# Patient Record
Sex: Female | Born: 1965 | Race: White | Hispanic: No | Marital: Married | Smoking: Never smoker
Health system: Southern US, Community
[De-identification: ages and names within clinical notes are randomized; demographics above are authoritative.]

## PROBLEM LIST (undated history)

## (undated) DIAGNOSIS — N6012 Diffuse cystic mastopathy of left breast: Secondary | ICD-10-CM

## (undated) DIAGNOSIS — E039 Hypothyroidism, unspecified: Secondary | ICD-10-CM

## (undated) HISTORY — PX: BREAST EXCISIONAL BIOPSY: SUR124

---
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
## 1999-06-23 ENCOUNTER — Other Ambulatory Visit: Admission: RE | Admit: 1999-06-23 | Discharge: 1999-06-23 | Payer: Self-pay | Admitting: Obstetrics & Gynecology

## 2000-07-27 ENCOUNTER — Other Ambulatory Visit: Admission: RE | Admit: 2000-07-27 | Discharge: 2000-07-27 | Payer: Self-pay | Admitting: Obstetrics & Gynecology

## 2001-08-21 ENCOUNTER — Other Ambulatory Visit: Admission: RE | Admit: 2001-08-21 | Discharge: 2001-08-21 | Payer: Self-pay | Admitting: Obstetrics & Gynecology

## 2002-10-02 ENCOUNTER — Other Ambulatory Visit: Admission: RE | Admit: 2002-10-02 | Discharge: 2002-10-02 | Payer: Self-pay | Admitting: Obstetrics and Gynecology

## 2003-10-22 ENCOUNTER — Other Ambulatory Visit: Admission: RE | Admit: 2003-10-22 | Discharge: 2003-10-22 | Payer: Self-pay | Admitting: Obstetrics & Gynecology

## 2004-12-02 ENCOUNTER — Other Ambulatory Visit: Admission: RE | Admit: 2004-12-02 | Discharge: 2004-12-02 | Payer: Self-pay | Admitting: Obstetrics & Gynecology

## 2005-12-20 ENCOUNTER — Other Ambulatory Visit: Admission: RE | Admit: 2005-12-20 | Discharge: 2005-12-20 | Payer: Self-pay | Admitting: Obstetrics & Gynecology

## 2017-10-08 DIAGNOSIS — Z681 Body mass index (BMI) 19 or less, adult: Secondary | ICD-10-CM | POA: Diagnosis not present

## 2017-10-08 DIAGNOSIS — Z01419 Encounter for gynecological examination (general) (routine) without abnormal findings: Secondary | ICD-10-CM | POA: Diagnosis not present

## 2017-10-25 DIAGNOSIS — H6120 Impacted cerumen, unspecified ear: Secondary | ICD-10-CM | POA: Diagnosis not present

## 2017-10-30 DIAGNOSIS — H6123 Impacted cerumen, bilateral: Secondary | ICD-10-CM | POA: Diagnosis not present

## 2017-10-30 DIAGNOSIS — H900 Conductive hearing loss, bilateral: Secondary | ICD-10-CM | POA: Diagnosis not present

## 2017-11-07 DIAGNOSIS — H10029 Other mucopurulent conjunctivitis, unspecified eye: Secondary | ICD-10-CM | POA: Diagnosis not present

## 2017-11-26 DIAGNOSIS — N390 Urinary tract infection, site not specified: Secondary | ICD-10-CM | POA: Diagnosis not present

## 2017-11-26 DIAGNOSIS — R300 Dysuria: Secondary | ICD-10-CM | POA: Diagnosis not present

## 2018-02-18 DIAGNOSIS — E559 Vitamin D deficiency, unspecified: Secondary | ICD-10-CM | POA: Diagnosis not present

## 2018-02-18 DIAGNOSIS — Z0000 Encounter for general adult medical examination without abnormal findings: Secondary | ICD-10-CM | POA: Diagnosis not present

## 2018-02-18 DIAGNOSIS — E039 Hypothyroidism, unspecified: Secondary | ICD-10-CM | POA: Diagnosis not present

## 2018-02-25 DIAGNOSIS — Z0000 Encounter for general adult medical examination without abnormal findings: Secondary | ICD-10-CM | POA: Diagnosis not present

## 2018-02-25 DIAGNOSIS — R748 Abnormal levels of other serum enzymes: Secondary | ICD-10-CM | POA: Diagnosis not present

## 2018-03-19 ENCOUNTER — Other Ambulatory Visit: Payer: Self-pay | Admitting: Radiology

## 2018-03-19 DIAGNOSIS — N643 Galactorrhea not associated with childbirth: Secondary | ICD-10-CM | POA: Diagnosis not present

## 2018-03-19 DIAGNOSIS — O926 Galactorrhea: Secondary | ICD-10-CM | POA: Diagnosis not present

## 2018-03-19 DIAGNOSIS — N6452 Nipple discharge: Secondary | ICD-10-CM

## 2018-03-20 ENCOUNTER — Ambulatory Visit
Admission: RE | Admit: 2018-03-20 | Discharge: 2018-03-20 | Disposition: A | Discharge status: Home / Self Care | Payer: 59 | Source: Ambulatory Visit | Attending: Radiology | Admitting: Radiology

## 2018-03-20 ENCOUNTER — Other Ambulatory Visit: Payer: Self-pay | Admitting: Radiology

## 2018-03-20 DIAGNOSIS — N6320 Unspecified lump in the left breast, unspecified quadrant: Secondary | ICD-10-CM | POA: Diagnosis not present

## 2018-03-20 DIAGNOSIS — N6452 Nipple discharge: Secondary | ICD-10-CM

## 2018-03-20 DIAGNOSIS — R922 Inconclusive mammogram: Secondary | ICD-10-CM | POA: Diagnosis not present

## 2018-03-26 ENCOUNTER — Other Ambulatory Visit: Payer: Self-pay | Admitting: Radiology

## 2018-03-26 ENCOUNTER — Ambulatory Visit
Admission: RE | Admit: 2018-03-26 | Discharge: 2018-03-26 | Disposition: A | Discharge status: Home / Self Care | Payer: 59 | Source: Ambulatory Visit | Attending: Radiology | Admitting: Radiology

## 2018-03-26 DIAGNOSIS — N6320 Unspecified lump in the left breast, unspecified quadrant: Secondary | ICD-10-CM

## 2018-03-26 DIAGNOSIS — D242 Benign neoplasm of left breast: Secondary | ICD-10-CM | POA: Diagnosis not present

## 2018-03-26 DIAGNOSIS — N6342 Unspecified lump in left breast, subareolar: Secondary | ICD-10-CM | POA: Diagnosis not present

## 2018-04-10 ENCOUNTER — Other Ambulatory Visit: Payer: Self-pay | Admitting: General Surgery

## 2018-04-10 DIAGNOSIS — E039 Hypothyroidism, unspecified: Secondary | ICD-10-CM | POA: Diagnosis not present

## 2018-04-10 DIAGNOSIS — N6012 Diffuse cystic mastopathy of left breast: Secondary | ICD-10-CM

## 2018-04-10 DIAGNOSIS — Z803 Family history of malignant neoplasm of breast: Secondary | ICD-10-CM | POA: Diagnosis not present

## 2018-04-22 ENCOUNTER — Other Ambulatory Visit: Payer: Self-pay

## 2018-04-22 ENCOUNTER — Encounter (HOSPITAL_BASED_OUTPATIENT_CLINIC_OR_DEPARTMENT_OTHER): Payer: Self-pay | Admitting: *Deleted

## 2018-04-25 ENCOUNTER — Encounter (HOSPITAL_BASED_OUTPATIENT_CLINIC_OR_DEPARTMENT_OTHER)
Admission: RE | Admit: 2018-04-25 | Discharge: 2018-04-25 | Disposition: A | Discharge status: Home / Self Care | Payer: 59 | Source: Ambulatory Visit | Attending: General Surgery | Admitting: General Surgery

## 2018-04-25 DIAGNOSIS — Z803 Family history of malignant neoplasm of breast: Secondary | ICD-10-CM | POA: Diagnosis not present

## 2018-04-25 DIAGNOSIS — Z79890 Hormone replacement therapy (postmenopausal): Secondary | ICD-10-CM | POA: Diagnosis not present

## 2018-04-25 DIAGNOSIS — Z8249 Family history of ischemic heart disease and other diseases of the circulatory system: Secondary | ICD-10-CM | POA: Diagnosis not present

## 2018-04-25 DIAGNOSIS — E039 Hypothyroidism, unspecified: Secondary | ICD-10-CM | POA: Diagnosis not present

## 2018-04-25 DIAGNOSIS — D242 Benign neoplasm of left breast: Secondary | ICD-10-CM | POA: Diagnosis not present

## 2018-04-25 LAB — CBC WITH DIFFERENTIAL/PLATELET
Abs Immature Granulocytes: 0 10*3/uL (ref 0.0–0.1)
Basophils Absolute: 0.1 10*3/uL (ref 0.0–0.1)
Basophils Relative: 1 %
EOS ABS: 0.1 10*3/uL (ref 0.0–0.7)
Eosinophils Relative: 2 %
HEMATOCRIT: 36 % (ref 36.0–46.0)
Hemoglobin: 12.4 g/dL (ref 12.0–15.0)
IMMATURE GRANULOCYTES: 1 %
Lymphocytes Relative: 32 %
Lymphs Abs: 1.4 10*3/uL (ref 0.7–4.0)
MCH: 30.3 pg (ref 26.0–34.0)
MCHC: 34.4 g/dL (ref 30.0–36.0)
MCV: 88 fL (ref 78.0–100.0)
MONOS PCT: 7 %
Monocytes Absolute: 0.3 10*3/uL (ref 0.1–1.0)
NEUTROS ABS: 2.6 10*3/uL (ref 1.7–7.7)
NEUTROS PCT: 57 %
Platelets: 158 10*3/uL (ref 150–400)
RBC: 4.09 MIL/uL (ref 3.87–5.11)
RDW: 13.3 % (ref 11.5–15.5)
WBC: 4.4 10*3/uL (ref 4.0–10.5)

## 2018-04-25 LAB — COMPREHENSIVE METABOLIC PANEL
ALT: 18 U/L (ref 0–44)
AST: 29 U/L (ref 15–41)
Albumin: 4 g/dL (ref 3.5–5.0)
Alkaline Phosphatase: 77 U/L (ref 38–126)
Anion gap: 9 (ref 5–15)
BUN: 20 mg/dL (ref 6–20)
CHLORIDE: 104 mmol/L (ref 98–111)
CO2: 25 mmol/L (ref 22–32)
CREATININE: 0.92 mg/dL (ref 0.44–1.00)
Calcium: 9.2 mg/dL (ref 8.9–10.3)
GFR calc non Af Amer: >60 mL/min (ref >60)
Glucose, Bld: 100 mg/dL — ABNORMAL HIGH (ref 70–99)
Potassium: 4.4 mmol/L (ref 3.5–5.1)
SODIUM: 138 mmol/L (ref 135–145)
Total Bilirubin: 0.9 mg/dL (ref 0.3–1.2)
Total Protein: 6.8 g/dL (ref 6.5–8.1)

## 2018-04-26 ENCOUNTER — Ambulatory Visit
Admission: RE | Admit: 2018-04-26 | Discharge: 2018-04-26 | Disposition: A | Discharge status: Home / Self Care | Payer: 59 | Source: Ambulatory Visit | Attending: General Surgery | Admitting: General Surgery

## 2018-04-26 DIAGNOSIS — N6012 Diffuse cystic mastopathy of left breast: Secondary | ICD-10-CM

## 2018-04-26 DIAGNOSIS — D242 Benign neoplasm of left breast: Secondary | ICD-10-CM | POA: Diagnosis not present

## 2018-04-27 NOTE — H&P (Signed)
Gabrielle Haney Location: [facility] Patient #: [id number] DOB: 10/05/1965 Married / Language: English / Race: White Female       History of Present Illness      This is a healthy 52 year old female, referred by Dr. Curlene Dolphin at the [facility] for evaluation of 2 retroareolar papillomas left breast. Gabrielle Haney is her PCP. [name] is her gynecologist.       She has no prior history of breast problems. This summer she started developing spontaneous yellowish drainage from the left nipple and she had a single episode of bright red blood from the left nipple. She did not perceive a mass. Imaging studies show a small intraductal mass in the left breast retroareolar area at 10 o'clock position and a second intraductal mass in the left breast retroareolar area and o'clock position. Ultrasound the axilla is negative. Both of these were biopsied and both showed intraductal papillomas. She developed a little bit of a hematoma but otherwise has done well.      Past history reveals she is healthy. She had surgery for aortic coarctation at age 52. She is hypothyroid. Otherwise very healthy and very active. Family history reveals mother had breast cancer 19 years ago. She survived. No ovarian or colon cancer. Social history reveals she is married with 3 children lives in [place]. 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 Denies tobacco. Takes alcohol rarely.      She would like these areas excised. He is a little bit concerned about breast cancer and very concerned about the drainage. She understands of the drainage will likely continue. She'll be scheduled for left breast lumpectomy 2 with radioactive seed localization 2. Think we can do this through a single medially placed circumareolar hidden scar technique. I discussed the indications, details, techniques, numerous risk of the surgery with her. She is aware of the risk of bleeding,  infection, reoperation of cancer, cosmetic deformity. She is aware the nipple will be somewhat flattened, likely will be insensate and without erectile function. She is comfortable with that. She understands all these issues. All her questions were answered. She agrees with this plan.   Past Surgical History  Breast Biopsy  Left.  Diagnostic Studies History Colonoscopy  1-5 years ago Mammogram  within last year  Allergies No Known Drug Allergies   Medication History ( Calcium Citrate (200MG  Tablet, 1 (one) Oral) Active. Multiple Vitamin (1 (one) Oral) Active. Vitamin D (400UNIT Capsule, 1 (one) Oral) Active.  Social History ( Alcohol use  Occasional alcohol use. Caffeine use  Tea. No drug use  Tobacco use  Never smoker.  Family History ( Breast Cancer  Mother. Diabetes Mellitus  Mother. Hypertension  Father. Prostate Cancer  Father. Thyroid problems  Mother.  Pregnancy / Birth History Age at menarche  47 years. Age of menopause  [age] Gravida  4 Length (months) of breastfeeding  3-6 Maternal age  3-30 Para  3  Other Problems  Heart murmur  Thyroid Disease     Review of Systems  General Not Present- Appetite Loss, Chills, Fatigue, Fever, Night Sweats, Weight Gain and Weight Loss. Skin Not Present- Change in Wart/Mole, Dryness, Hives, Jaundice, New Lesions, Non-Healing Wounds, Rash and Ulcer. HEENT Present- Seasonal Allergies. Not Present- Earache, Hearing Loss, Hoarseness, Nose Bleed, Oral Ulcers, Ringing in the Ears, Sinus Pain, Sore Throat, Visual Disturbances, Wears glasses/contact lenses and Yellow Eyes. Breast Present- Breast Mass and Nipple Discharge. Not Present-  Breast Pain and Skin Changes.  Vitals  Weight: 119.6 lb Height: 66in Body Surface Area: 1.61 m Body Mass Index: 19.3 kg/m  Temp.: 20F  Pulse: 95 (Regular)  BP: 140/70 (Sitting, Left Arm, Standard)     Physical Exam  General Mental  Status-Alert. General Appearance-Consistent with stated age. Hydration-Well hydrated. Voice-Normal.  Head and Neck Head-normocephalic, atraumatic with no lesions or palpable masses. Trachea-midline. Thyroid Gland Characteristics - normal size and consistency.  Eye Eyeball - Bilateral-Extraocular movements intact. Sclera/Conjunctiva - Bilateral-No scleral icterus.  Chest and Lung Exam Chest and lung exam reveals -quiet, even and easy respiratory effort with no use of accessory muscles and on auscultation, normal breath sounds, no adventitious sounds and normal vocal resonance. Inspection Chest Wall - Normal. Back - normal. Note: Left thoracotomy incision well-healed from aortic coarctation surgery   Breast Note: Breast are not enlarged. Medium size. Mild diffuse benign lumpiness. Some ecchymoses in the left breast centrally and medially. I could feel small hematomas in the area of the biopsies. There is no other mass. There is no axillary adenopathy.   Cardiovascular Cardiovascular examination reveals -normal heart sounds, regular rate and rhythm with no murmurs and normal pedal pulses bilaterally.  Abdomen Inspection Inspection of the abdomen reveals - No Hernias. Skin - Scar - no surgical scars. Palpation/Percussion Palpation and Percussion of the abdomen reveal - Soft, Non Tender, No Rebound tenderness, No Rigidity (guarding) and No hepatosplenomegaly. Auscultation Auscultation of the abdomen reveals - Bowel sounds normal.  Neurologic Neurologic evaluation reveals -alert and oriented x 3 with no impairment of recent or remote memory. Mental Status-Normal.  Musculoskeletal Normal Exam - Left-Upper Extremity Strength Normal and Lower Extremity Strength Normal. Normal Exam - Right-Upper Extremity Strength Normal and Lower Extremity Strength Normal.  Lymphatic Head & Neck  General Head & Neck Lymphatics: Bilateral - Description -  Normal. Axillary  General Axillary Region: Bilateral - Description - Normal. Tenderness - Non Tender. Femoral & Inguinal  Generalized Femoral & Inguinal Lymphatics: Bilateral - Description - Normal. Tenderness - Non Tender.    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 Imaging studies and biopsy showed 2 intraductal papillomas in the left breast, one at the 10 o'clock position and one at the 8 o'clock position behind the areola. Most likely these areas are benign You are at somewhat increased risk for breast cancer because of your mother's history of breast cancer and because of the papillomas The bleeding will likely continue intermittently  We both agree that proceeding with excision of these areas is indicated you will be scheduled for left breast lumpectomy with radioactive seed 2 I described the incision. Techniques. Indications, and complications in detail  We will try to expedite this surgery  Pt Education - Pamphlet Given - Breast Biopsy: discussed with patient and provided information. Pt Education - [facility] Breast Biopsy HCI: discussed with patient and provided information. FAMILY HISTORY OF BREAST CANCER IN MOTHER (Z80.3) HYPOTHYROIDISM, ADULT (E03.9)    Fidel Caggiano M. Dalbert Batman, M.D., [facility] General and Minimally invasive Surgery Breast and Colorectal Surgery Office:   [phone] Pager:   [phone]

## 2018-04-29 ENCOUNTER — Ambulatory Visit
Admission: RE | Admit: 2018-04-29 | Discharge: 2018-04-29 | Disposition: A | Discharge status: Home / Self Care | Payer: 59 | Source: Ambulatory Visit | Attending: General Surgery | Admitting: General Surgery

## 2018-04-29 ENCOUNTER — Encounter (HOSPITAL_BASED_OUTPATIENT_CLINIC_OR_DEPARTMENT_OTHER): Payer: Self-pay | Admitting: Certified Registered"

## 2018-04-29 ENCOUNTER — Ambulatory Visit (HOSPITAL_BASED_OUTPATIENT_CLINIC_OR_DEPARTMENT_OTHER): Payer: 59 | Admitting: Certified Registered"

## 2018-04-29 ENCOUNTER — Ambulatory Visit (HOSPITAL_BASED_OUTPATIENT_CLINIC_OR_DEPARTMENT_OTHER)
Admission: RE | Admit: 2018-04-29 | Discharge: 2018-04-29 | Disposition: A | Discharge status: Home / Self Care | Payer: 59 | Source: Ambulatory Visit | Attending: General Surgery | Admitting: General Surgery

## 2018-04-29 ENCOUNTER — Other Ambulatory Visit: Payer: Self-pay

## 2018-04-29 ENCOUNTER — Encounter (HOSPITAL_BASED_OUTPATIENT_CLINIC_OR_DEPARTMENT_OTHER)
Admission: RE | Disposition: A | Discharge status: Home / Self Care | Payer: Self-pay | Source: Ambulatory Visit | Attending: General Surgery

## 2018-04-29 DIAGNOSIS — Z803 Family history of malignant neoplasm of breast: Secondary | ICD-10-CM | POA: Diagnosis not present

## 2018-04-29 DIAGNOSIS — N6012 Diffuse cystic mastopathy of left breast: Secondary | ICD-10-CM

## 2018-04-29 DIAGNOSIS — D242 Benign neoplasm of left breast: Secondary | ICD-10-CM | POA: Insufficient documentation

## 2018-04-29 DIAGNOSIS — Z8249 Family history of ischemic heart disease and other diseases of the circulatory system: Secondary | ICD-10-CM | POA: Insufficient documentation

## 2018-04-29 DIAGNOSIS — E039 Hypothyroidism, unspecified: Secondary | ICD-10-CM | POA: Diagnosis not present

## 2018-04-29 DIAGNOSIS — D0582 Other specified type of carcinoma in situ of left breast: Secondary | ICD-10-CM | POA: Diagnosis not present

## 2018-04-29 DIAGNOSIS — Z79890 Hormone replacement therapy (postmenopausal): Secondary | ICD-10-CM | POA: Insufficient documentation

## 2018-04-29 HISTORY — DX: Diffuse cystic mastopathy of left breast: N60.12

## 2018-04-29 HISTORY — DX: Hypothyroidism, unspecified: E03.9

## 2018-04-29 HISTORY — PX: BREAST LUMPECTOMY WITH RADIOACTIVE SEED LOCALIZATION: SHX6424

## 2018-04-29 SURGERY — BREAST LUMPECTOMY WITH RADIOACTIVE SEED LOCALIZATION
Anesthesia: General | Site: Breast | Laterality: Left

## 2018-04-29 MED ORDER — BUPIVACAINE-EPINEPHRINE (PF) 0.5% -1:200000 IJ SOLN
INTRAMUSCULAR | Status: AC
  Filled 2018-04-29: qty 60

## 2018-04-29 MED ORDER — ACETAMINOPHEN 500 MG PO TABS
1000.0000 mg | ORAL_TABLET | ORAL | Status: AC
  Administered 2018-04-29: 1000 mg via ORAL

## 2018-04-29 MED ORDER — CELECOXIB 200 MG PO CAPS
ORAL_CAPSULE | ORAL | Status: AC
  Filled 2018-04-29: qty 1

## 2018-04-29 MED ORDER — ONDANSETRON HCL 4 MG/2ML IJ SOLN
INTRAMUSCULAR | Status: DC | PRN
  Administered 2018-04-29: 4 mg via INTRAVENOUS

## 2018-04-29 MED ORDER — CELECOXIB 200 MG PO CAPS
200.0000 mg | ORAL_CAPSULE | ORAL | Status: AC
  Administered 2018-04-29: 200 mg via ORAL

## 2018-04-29 MED ORDER — LACTATED RINGERS IV SOLN
INTRAVENOUS | Status: DC
  Administered 2018-04-29 (×2): via INTRAVENOUS

## 2018-04-29 MED ORDER — DEXAMETHASONE SODIUM PHOSPHATE 4 MG/ML IJ SOLN
INTRAMUSCULAR | Status: DC | PRN
  Administered 2018-04-29: 10 mg via INTRAVENOUS

## 2018-04-29 MED ORDER — PROPOFOL 10 MG/ML IV BOLUS
INTRAVENOUS | Status: DC | PRN
  Administered 2018-04-29: 150 mg via INTRAVENOUS

## 2018-04-29 MED ORDER — GABAPENTIN 300 MG PO CAPS
300.0000 mg | ORAL_CAPSULE | ORAL | Status: AC
  Administered 2018-04-29: 300 mg via ORAL

## 2018-04-29 MED ORDER — FENTANYL CITRATE (PF) 100 MCG/2ML IJ SOLN
25.0000 ug | INTRAMUSCULAR | Status: DC | PRN
Start: 2018-04-29 — End: 2018-04-29

## 2018-04-29 MED ORDER — FENTANYL CITRATE (PF) 100 MCG/2ML IJ SOLN
50.0000 ug | INTRAMUSCULAR | Status: DC | PRN
  Administered 2018-04-29: 50 ug via INTRAVENOUS

## 2018-04-29 MED ORDER — SCOPOLAMINE 1 MG/3DAYS TD PT72
1.0000 | MEDICATED_PATCH | Freq: Once | TRANSDERMAL | Status: AC | PRN
  Administered 2018-04-29: 1 via TRANSDERMAL

## 2018-04-29 MED ORDER — HYDROCODONE-ACETAMINOPHEN 5-325 MG PO TABS: 1.0000 | ORAL_TABLET | Freq: Four times a day (QID) | ORAL | 0 refills | Status: DC | PRN

## 2018-04-29 MED ORDER — SCOPOLAMINE 1 MG/3DAYS TD PT72
MEDICATED_PATCH | TRANSDERMAL | Status: AC
  Filled 2018-04-29: qty 1

## 2018-04-29 MED ORDER — GABAPENTIN 300 MG PO CAPS
ORAL_CAPSULE | ORAL | Status: AC
Start: 2018-04-29 — End: ?
  Filled 2018-04-29: qty 1

## 2018-04-29 MED ORDER — ACETAMINOPHEN 500 MG PO TABS
ORAL_TABLET | ORAL | Status: AC
  Filled 2018-04-29: qty 2

## 2018-04-29 MED ORDER — CEFAZOLIN SODIUM-DEXTROSE 2-4 GM/100ML-% IV SOLN
INTRAVENOUS | Status: AC
  Filled 2018-04-29: qty 100

## 2018-04-29 MED ORDER — CHLORHEXIDINE GLUCONATE CLOTH 2 % EX PADS: 6.0000 | MEDICATED_PAD | Freq: Once | CUTANEOUS | Status: DC

## 2018-04-29 MED ORDER — MIDAZOLAM HCL 2 MG/2ML IJ SOLN
1.0000 mg | INTRAMUSCULAR | Status: DC | PRN
  Administered 2018-04-29: 2 mg via INTRAVENOUS

## 2018-04-29 MED ORDER — BUPIVACAINE-EPINEPHRINE (PF) 0.5% -1:200000 IJ SOLN
INTRAMUSCULAR | Status: DC | PRN
  Administered 2018-04-29: 9 mL

## 2018-04-29 MED ORDER — CEFAZOLIN SODIUM-DEXTROSE 2-4 GM/100ML-% IV SOLN
2.0000 g | INTRAVENOUS | Status: AC
  Administered 2018-04-29: 2 g via INTRAVENOUS

## 2018-04-29 MED ORDER — LIDOCAINE HCL (CARDIAC) PF 100 MG/5ML IV SOSY
PREFILLED_SYRINGE | INTRAVENOUS | Status: DC | PRN
  Administered 2018-04-29: 60 mg via INTRAVENOUS

## 2018-04-29 MED ORDER — MIDAZOLAM HCL 2 MG/2ML IJ SOLN
INTRAMUSCULAR | Status: AC
  Filled 2018-04-29: qty 2

## 2018-04-29 MED ORDER — FENTANYL CITRATE (PF) 100 MCG/2ML IJ SOLN
INTRAMUSCULAR | Status: AC
  Filled 2018-04-29: qty 2

## 2018-04-29 SURGICAL SUPPLY — 66 items
ADH SKN CLS APL DERMABOND .7 (GAUZE/BANDAGES/DRESSINGS) ×1
APL SKNCLS STERI-STRIP NONHPOA (GAUZE/BANDAGES/DRESSINGS)
APPLIER CLIP 9.375 MED OPEN (MISCELLANEOUS) ×3
APR CLP MED 9.3 20 MLT OPN (MISCELLANEOUS) ×1
BENZOIN TINCTURE PRP APPL 2/3 (GAUZE/BANDAGES/DRESSINGS) IMPLANT
BINDER BREAST LRG (GAUZE/BANDAGES/DRESSINGS) IMPLANT
BINDER BREAST MEDIUM (GAUZE/BANDAGES/DRESSINGS) ×2 IMPLANT
BINDER BREAST XLRG (GAUZE/BANDAGES/DRESSINGS) IMPLANT
BINDER BREAST XXLRG (GAUZE/BANDAGES/DRESSINGS) IMPLANT
BLADE HEX COATED 2.75 (ELECTRODE) ×3 IMPLANT
BLADE SURG 10 STRL SS (BLADE) IMPLANT
BLADE SURG 15 STRL LF DISP TIS (BLADE) ×1 IMPLANT
BLADE SURG 15 STRL SS (BLADE) ×6
CANISTER SUC SOCK COL 7IN (MISCELLANEOUS) IMPLANT
CANISTER SUCT 1200ML W/VALVE (MISCELLANEOUS) ×3 IMPLANT
CHLORAPREP W/TINT 26ML (MISCELLANEOUS) ×3 IMPLANT
CLIP APPLIE 9.375 MED OPEN (MISCELLANEOUS) IMPLANT
CLOSURE WOUND 1/2 X4 (GAUZE/BANDAGES/DRESSINGS)
COVER BACK TABLE 60X90IN (DRAPES) ×3 IMPLANT
COVER MAYO STAND STRL (DRAPES) ×3 IMPLANT
COVER PROBE W GEL 5X96 (DRAPES) ×3 IMPLANT
DECANTER SPIKE VIAL GLASS SM (MISCELLANEOUS) ×2 IMPLANT
DERMABOND ADVANCED (GAUZE/BANDAGES/DRESSINGS) ×2
DERMABOND ADVANCED .7 DNX12 (GAUZE/BANDAGES/DRESSINGS) ×1 IMPLANT
DEVICE DUBIN W/COMP PLATE 8390 (MISCELLANEOUS) ×3 IMPLANT
DRAPE LAPAROSCOPIC ABDOMINAL (DRAPES) ×3 IMPLANT
DRAPE UTILITY XL STRL (DRAPES) ×3 IMPLANT
DRSG PAD ABDOMINAL 8X10 ST (GAUZE/BANDAGES/DRESSINGS) IMPLANT
ELECT REM PT RETURN 9FT ADLT (ELECTROSURGICAL) ×3
ELECTRODE REM PT RTRN 9FT ADLT (ELECTROSURGICAL) ×1 IMPLANT
GAUZE SPONGE 4X4 12PLY STRL LF (GAUZE/BANDAGES/DRESSINGS) ×2 IMPLANT
GLOVE BIOGEL PI IND STRL 6.5 (GLOVE) IMPLANT
GLOVE BIOGEL PI INDICATOR 6.5 (GLOVE) ×2
GLOVE ECLIPSE 6.5 STRL STRAW (GLOVE) ×2 IMPLANT
GLOVE EUDERMIC 7 POWDERFREE (GLOVE) ×3 IMPLANT
GOWN STRL REUS W/ TWL LRG LVL3 (GOWN DISPOSABLE) ×1 IMPLANT
GOWN STRL REUS W/ TWL XL LVL3 (GOWN DISPOSABLE) ×1 IMPLANT
GOWN STRL REUS W/TWL LRG LVL3 (GOWN DISPOSABLE) ×3
GOWN STRL REUS W/TWL XL LVL3 (GOWN DISPOSABLE) ×3
ILLUMINATOR WAVEGUIDE N/F (MISCELLANEOUS) IMPLANT
KIT MARKER MARGIN INK (KITS) ×3 IMPLANT
LIGHT WAVEGUIDE WIDE FLAT (MISCELLANEOUS) IMPLANT
NDL HYPO 25X1 1.5 SAFETY (NEEDLE) ×1 IMPLANT
NEEDLE HYPO 25X1 1.5 SAFETY (NEEDLE) ×3 IMPLANT
NS IRRIG 1000ML POUR BTL (IV SOLUTION) ×3 IMPLANT
PACK BASIN DAY SURGERY FS (CUSTOM PROCEDURE TRAY) ×3 IMPLANT
PENCIL BUTTON HOLSTER BLD 10FT (ELECTRODE) ×3 IMPLANT
SHEET MEDIUM DRAPE 40X70 STRL (DRAPES) IMPLANT
SLEEVE SCD COMPRESS KNEE MED (MISCELLANEOUS) ×3 IMPLANT
SPONGE LAP 18X18 RF (DISPOSABLE) ×2 IMPLANT
SPONGE LAP 4X18 RFD (DISPOSABLE) ×1 IMPLANT
STRIP CLOSURE SKIN 1/2X4 (GAUZE/BANDAGES/DRESSINGS) IMPLANT
SUT ETHILON 3 0 FSL (SUTURE) IMPLANT
SUT MNCRL AB 4-0 PS2 18 (SUTURE) ×3 IMPLANT
SUT SILK 2 0 SH (SUTURE) ×3 IMPLANT
SUT VIC AB 2-0 CT1 27 (SUTURE)
SUT VIC AB 2-0 CT1 TAPERPNT 27 (SUTURE) IMPLANT
SUT VIC AB 3-0 SH 27 (SUTURE)
SUT VIC AB 3-0 SH 27X BRD (SUTURE) IMPLANT
SUT VICRYL 3-0 CR8 SH (SUTURE) ×3 IMPLANT
SYR 10ML LL (SYRINGE) ×5 IMPLANT
TOWEL GREEN STERILE FF (TOWEL DISPOSABLE) ×3 IMPLANT
TOWEL OR NON WOVEN STRL DISP B (DISPOSABLE) IMPLANT
TUBE CONNECTING 20'X1/4 (TUBING) ×1
TUBE CONNECTING 20X1/4 (TUBING) ×2 IMPLANT
YANKAUER SUCT BULB TIP NO VENT (SUCTIONS) ×3 IMPLANT

## 2018-04-29 NOTE — Transfer of Care (Signed)
Immediate Anesthesia Transfer of Care Note  Patient: Gabrielle Haney  Procedure(s) Performed: LEFT BREAST LUMPECTOMY X'S 2 WITH RADIOACTIVE SEED LOCALIZATION X'S 2 (Left Breast)  Patient Location: PACU  Anesthesia Type:General  Level of Consciousness: awake and patient cooperative  Airway & Oxygen Therapy: Patient Spontanous Breathing and Patient connected to face mask oxygen  Post-op Assessment: Report given to RN and Post -op Vital signs reviewed and stable  Post vital signs: Reviewed and stable  Last Vitals:  Vitals Value Taken Time  BP    Temp    Pulse 72 04/29/2018  8:17 AM  Resp    SpO2 100 % 04/29/2018  8:17 AM  Vitals shown include unvalidated device data.  Last Pain:  Vitals:   04/29/18 0640  TempSrc: Oral         Complications: No apparent anesthesia complications

## 2018-04-29 NOTE — Anesthesia Preprocedure Evaluation (Signed)
Anesthesia Evaluation  Patient identified by MRN, date of birth, ID band Patient awake    Reviewed: Allergy & Precautions, NPO status , Patient's Chart, lab work & pertinent test results  Airway Mallampati: II  TM Distance: >3 FB     Dental   Pulmonary neg pulmonary ROS,    breath sounds clear to auscultation       Cardiovascular negative cardio ROS   Rhythm:Regular Rate:Normal     Neuro/Psych    GI/Hepatic negative GI ROS, Neg liver ROS,   Endo/Other  Hypothyroidism   Renal/GU      Musculoskeletal   Abdominal   Peds  Hematology   Anesthesia Other Findings   Reproductive/Obstetrics                             Anesthesia Physical Anesthesia Plan  ASA: II  Anesthesia Plan: General   Post-op Pain Management:    Induction: Intravenous  PONV Risk Score and Plan: Ondansetron, Dexamethasone, Midazolam and Treatment may vary due to age or medical condition  Airway Management Planned: LMA  Additional Equipment:   Intra-op Plan:   Post-operative Plan: Extubation in OR  Informed Consent: I have reviewed the patients History and Physical, chart, labs and discussed the procedure including the risks, benefits and alternatives for the proposed anesthesia with the patient or authorized representative who has indicated his/her understanding and acceptance.   Dental advisory given  Plan Discussed with: CRNA and Anesthesiologist  Anesthesia Plan Comments:         Anesthesia Quick Evaluation

## 2018-04-29 NOTE — Anesthesia Postprocedure Evaluation (Signed)
Anesthesia Post Note  Patient: Gabrielle Haney  Procedure(s) Performed: LEFT BREAST LUMPECTOMY X'S 2 WITH RADIOACTIVE SEED LOCALIZATION X'S 2 (Left Breast)     Patient location during evaluation: PACU Anesthesia Type: General Level of consciousness: awake Vital Signs Assessment: post-procedure vital signs reviewed and stable Respiratory status: spontaneous breathing Cardiovascular status: stable Postop Assessment: no apparent nausea or vomiting Anesthetic complications: no    Last Vitals:  Vitals:   04/29/18 0915 04/29/18 0946  BP: (!) 146/71 137/78  Pulse: 71 79  Resp: 16 16  Temp:  37 C  SpO2: 100% 98%    Last Pain:  Vitals:   04/29/18 0946  TempSrc: Oral  PainSc: 0-No pain                 Goble Fudala

## 2018-04-29 NOTE — Anesthesia Procedure Notes (Signed)
Procedure Name: LMA Insertion Date/Time: 04/29/2018 7:24 AM Performed by: Signe Colt, CRNA Pre-anesthesia Checklist: Patient identified, Emergency Drugs available, Suction available and Patient being monitored Patient Re-evaluated:Patient Re-evaluated prior to induction Oxygen Delivery Method: Circle system utilized Preoxygenation: Pre-oxygenation with 100% oxygen Induction Type: IV induction Ventilation: Mask ventilation without difficulty LMA: LMA inserted LMA Size: 4.0 Number of attempts: 1 Airway Equipment and Method: Bite block Placement Confirmation: positive ETCO2 Tube secured with: Tape Dental Injury: Teeth and Oropharynx as per pre-operative assessment

## 2018-04-29 NOTE — Interval H&P Note (Signed)
History and Physical Interval Note:  04/29/2018 6:59 AM  Gabrielle Haney  has presented today for surgery, with the diagnosis of MULTIPLE LEFT BREAST PAPILLOMAS  The various methods of treatment have been discussed with the patient and family. After consideration of risks, benefits and other options for treatment, the patient has consented to  Procedure(s): LEFT BREAST LUMPECTOMY X'S 2 WITH RADIOACTIVE SEED LOCALIZATION X'S 2 (Left) as a surgical intervention .  The patient's history has been reviewed, patient examined, no change in status, stable for surgery.  I have reviewed the patient's chart and labs.  Questions were answered to the patient's satisfaction.     Adin Hector

## 2018-04-29 NOTE — Discharge Instructions (Signed)
°Post Anesthesia Home Care Instructions ° °Activity: °Get plenty of rest for the remainder of the day. A responsible individual must stay with you for 24 hours following the procedure.  °For the next 24 hours, DO NOT: °-Drive a car °-Operate machinery °-Drink alcoholic beverages °-Take any medication unless instructed by your physician °-Make any legal decisions or sign important papers. ° °Meals: °Start with liquid foods such as gelatin or soup. Progress to regular foods as tolerated. Avoid greasy, spicy, heavy foods. If nausea and/or vomiting occur, drink only clear liquids until the nausea and/or vomiting subsides. Call your physician if vomiting continues. ° °Special Instructions/Symptoms: °Your throat may feel dry or sore from the anesthesia or the breathing tube placed in your throat during surgery. If this causes discomfort, gargle with warm salt water. The discomfort should disappear within 24 hours. ° °If you had a scopolamine patch placed behind your ear for the management of post- operative nausea and/or vomiting: ° °1. The medication in the patch is effective for 72 hours, after which it should be removed.  Wrap patch in a tissue and discard in the trash. Wash hands thoroughly with soap and water. °2. You may remove the patch earlier than 72 hours if you experience unpleasant side effects which may include dry mouth, dizziness or visual disturbances. °3. Avoid touching the patch. Wash your hands with soap and water after contact with the patch. °  ° ° ° ° °[facility] °Office Phone Number [phone] ° °BREAST BIOPSY/ PARTIAL MASTECTOMY: POST OP INSTRUCTIONS ° °Always review your discharge instruction sheet given to you by the facility where your surgery was performed. ° °IF YOU HAVE DISABILITY OR FAMILY LEAVE FORMS, YOU MUST BRING THEM TO THE OFFICE FOR PROCESSING.  DO NOT GIVE THEM TO YOUR DOCTOR. ° °1. A prescription for pain medication may be given to you upon discharge.  Take your  pain medication as prescribed, if needed.  If narcotic pain medicine is not needed, then you may take acetaminophen (Tylenol) or ibuprofen (Advil) as needed. °2. Take your usually prescribed medications unless otherwise directed °3. If you need a refill on your pain medication, please contact your pharmacy.  They will contact our office to request authorization.  Prescriptions will not be filled after 5pm or on week-ends. °4. You should eat very light the first 24 hours after surgery, such as soup, crackers, pudding, etc.  Resume your normal diet the day after surgery. °5. Most patients will experience some swelling and bruising in the breast.  Ice packs and a good support bra will help.  Swelling and bruising can take several days to resolve.  °6. It is common to experience some constipation if taking pain medication after surgery.  Increasing fluid intake and taking a stool softener will usually help or prevent this problem from occurring.  A mild laxative (Milk of Magnesia or Miralax) should be taken according to package directions if there are no bowel movements after 48 hours. °7. Unless discharge instructions indicate otherwise, you may remove your bandages 24-48 hours after surgery, and you may shower at that time.  You may have steri-strips (small skin tapes) in place directly over the incision.  These strips should be left on the skin for 7-10 days.  If your surgeon used skin glue on the incision, you may shower in 24 hours.  The glue will flake off over the next 2-3 weeks.  Any sutures or staples will be removed at the office during your follow-up visit. °  8. ACTIVITIES:  You may resume regular daily activities (gradually increasing) beginning the next day.  Wearing a good support bra or sports bra minimizes pain and swelling.  You may have sexual intercourse when it is comfortable. °a. You may drive when you no longer are taking prescription pain medication, you can comfortably wear a seatbelt, and you can  safely maneuver your car and apply brakes. °b. RETURN TO WORK:  ______________________________________________________________________________________ °9. You should see your doctor in the office for a follow-up appointment approximately two weeks after your surgery.  Your doctor’s nurse will typically make your follow-up appointment when she calls you with your pathology report.  Expect your pathology report 2-3 business days after your surgery.  You may call to check if you do not hear from us after three days. °10. OTHER INSTRUCTIONS: _______________________________________________________________________________________________ _____________________________________________________________________________________________________________________________________ °_____________________________________________________________________________________________________________________________________ °_____________________________________________________________________________________________________________________________________ ° °WHEN TO CALL YOUR DOCTOR: °1. Fever over 101.0 °2. Nausea and/or vomiting. °3. Extreme swelling or bruising. °4. Continued bleeding from incision. °5. Increased pain, redness, or drainage from the incision. ° °The clinic staff is available to answer your questions during regular business hours.  Please don’t hesitate to call and ask to speak to one of the nurses for clinical concerns.  If you have a medical emergency, go to the nearest emergency room or call 911.  A surgeon from [facility] is always on call at the hospital. ° °For further questions, please visit [email or web address]  °

## 2018-04-29 NOTE — Op Note (Signed)
Patient Name:           Gabrielle Haney   Date of Surgery:        04/29/2018  Pre op Diagnosis:      Multiple subareolar papillomas left breast  Post op Diagnosis:    Same  Procedure:                 Left breast lumpectomy x2 with radioactive seed localization x2  Surgeon:                     Edsel Petrin. Dalbert Batman, M.D., FACS  Assistant:                      OR staff  Operative Indications:   This is a healthy 52 year old female, referred by Dr. Curlene Dolphin at the [facility] for evaluation of 2 retroareolar papillomas left breast. Gabrielle Haney is her PCP. Gabrielle Haney is her gynecologist.       She has no prior history of breast problems. This summer she started developing spontaneous yellowish drainage from the left nipple and she had a single episode of bright red blood from the left nipple. She did not perceive a mass. Imaging studies show a small intraductal mass in the left breast retroareolar area at 10 o'clock position and a second intraductal mass in the left breast retroareolar area at the  8 o'clock position. Ultrasound of the axilla is negative. Both of these were biopsied and both showed intraductal papillomas. Family history reveals mother had breast cancer 19 years ago. She survived. No ovarian or colon cancer.      She would like these areas excised.She'll be scheduled for left breast lumpectomy 2 with radioactive seed localization 2.  She agrees with this plan  Operative Findings:       Both seeds were retrieved.  They were at the 8:00 and 10 o'clock position.  1 of these seeds was extremely superficial and fell off of the specimen during the procedure and was retrieved and imaged separately in a specimen cup.  The specimen mammogram looked good showing 1 seed and 2 of the original titanium biopsy clips.  The second seed was seen in the specimen cup.  Radiology felt that this was appropriate.  Procedure in Detail:          Following the induction of general LMA anesthesia the  patient's left breast was prepped and draped in a sterile fashion.  Surgical timeout was performed.  Intravenous antibiotics were given.  0.5% Marcaine with epinephrine was used as local infiltration anesthetic.  Using the neoprobe isolated the areas of radiographic signal.  I made a circumareolar incision medially at the areolar margin.  Lumpectomy was performed using the neoprobe frequently and electrocautery.  The specimen was removed and imaged with findings as described above.  The specimen was marked with silk sutures and a 6 color ink kit to orient the pathologist.  The specimen and the second seed were sent as to separate specimens.  Pathology retrieved  the seeds.  The wound was irrigated with saline.  Hemostasis was excellent.  The lumpectomy cavity was marked with 5 metal marker clips to assist in future imaging.  The breast tissues were reapproximated in 2 separate layers with interrupted 3-0 Vicryl sutures and the skin closed with running suture of 4-0 Monocryl and Dermabond.  Breast binder was placed and the patient taken to PACU in stable condition.  EBL 10 cc.  Counts correct.  Complications none.   Addendum: I logged onto the [employer] and reviewed her prescription medication history     Gabrielle Haney M. Dalbert Batman, M.D., FACS General and Minimally Invasive Surgery Breast and Colorectal Surgery  04/29/2018 8:14 AM

## 2018-04-30 ENCOUNTER — Encounter (HOSPITAL_BASED_OUTPATIENT_CLINIC_OR_DEPARTMENT_OTHER): Payer: Self-pay | Admitting: General Surgery

## 2018-04-30 NOTE — Progress Notes (Signed)
Inform patient of Pathology report,.Final pathology showed 2 papillomas.  These are completely benign.  No evidence of cancer.  I will discuss this with her at the next office visit Please admitted that she reached her   hmi

## 2018-06-26 DIAGNOSIS — H6123 Impacted cerumen, bilateral: Secondary | ICD-10-CM | POA: Diagnosis not present

## 2018-06-26 DIAGNOSIS — J069 Acute upper respiratory infection, unspecified: Secondary | ICD-10-CM | POA: Diagnosis not present

## 2018-07-04 DIAGNOSIS — H6123 Impacted cerumen, bilateral: Secondary | ICD-10-CM | POA: Diagnosis not present

## 2018-10-09 ENCOUNTER — Other Ambulatory Visit: Payer: Self-pay | Admitting: Radiology

## 2018-10-09 DIAGNOSIS — N6452 Nipple discharge: Secondary | ICD-10-CM

## 2018-10-09 DIAGNOSIS — Z681 Body mass index (BMI) 19 or less, adult: Secondary | ICD-10-CM | POA: Diagnosis not present

## 2018-10-09 DIAGNOSIS — Z01419 Encounter for gynecological examination (general) (routine) without abnormal findings: Secondary | ICD-10-CM | POA: Diagnosis not present

## 2018-10-15 ENCOUNTER — Ambulatory Visit
Admission: RE | Admit: 2018-10-15 | Discharge: 2018-10-15 | Disposition: A | Discharge status: Home / Self Care | Payer: 59 | Source: Ambulatory Visit | Attending: Radiology | Admitting: Radiology

## 2018-10-15 ENCOUNTER — Ambulatory Visit: Payer: 59

## 2018-10-15 DIAGNOSIS — N6452 Nipple discharge: Secondary | ICD-10-CM

## 2018-10-15 DIAGNOSIS — R922 Inconclusive mammogram: Secondary | ICD-10-CM | POA: Diagnosis not present

## 2018-10-24 ENCOUNTER — Other Ambulatory Visit: Payer: Self-pay | Admitting: Obstetrics & Gynecology

## 2018-10-24 ENCOUNTER — Other Ambulatory Visit: Payer: Self-pay | Admitting: Internal Medicine

## 2018-10-24 DIAGNOSIS — Z803 Family history of malignant neoplasm of breast: Secondary | ICD-10-CM

## 2019-03-28 ENCOUNTER — Ambulatory Visit
Admission: RE | Admit: 2019-03-28 | Discharge: 2019-03-28 | Disposition: A | Discharge status: Home / Self Care | Payer: No Typology Code available for payment source | Source: Ambulatory Visit | Attending: Obstetrics & Gynecology | Admitting: Obstetrics & Gynecology

## 2019-03-28 ENCOUNTER — Other Ambulatory Visit: Payer: Self-pay

## 2019-03-28 DIAGNOSIS — Z803 Family history of malignant neoplasm of breast: Secondary | ICD-10-CM

## 2019-03-28 MED ORDER — GADOBUTROL 1 MMOL/ML IV SOLN
6.0000 mL | Freq: Once | INTRAVENOUS | Status: AC | PRN
  Administered 2019-03-28: 6 mL via INTRAVENOUS

## 2019-06-11 IMAGING — MG NEEDLE LOCALIZATION OF THE LEFT BREAST WITH MAMMO GUIDANCE
1 series · 1 of 1 positions shown · non-contrast
Comparison: Previous exam(s).

CLINICAL DATA: Biopsy-proven high risk papillomas involving the
UPPER INNER subareolar LEFT breast and the UPPER OUTER subareolar
LEFT breast. Radioactive seed localization is performed in
anticipation of excisional biopsies on [REDACTED], 04/29/2018.

EXAM:
MAMMOGRAPHIC GUIDED RADIOACTIVE SEED LOCALIZATION OF THE LEFT BREAST
x 2

[L]
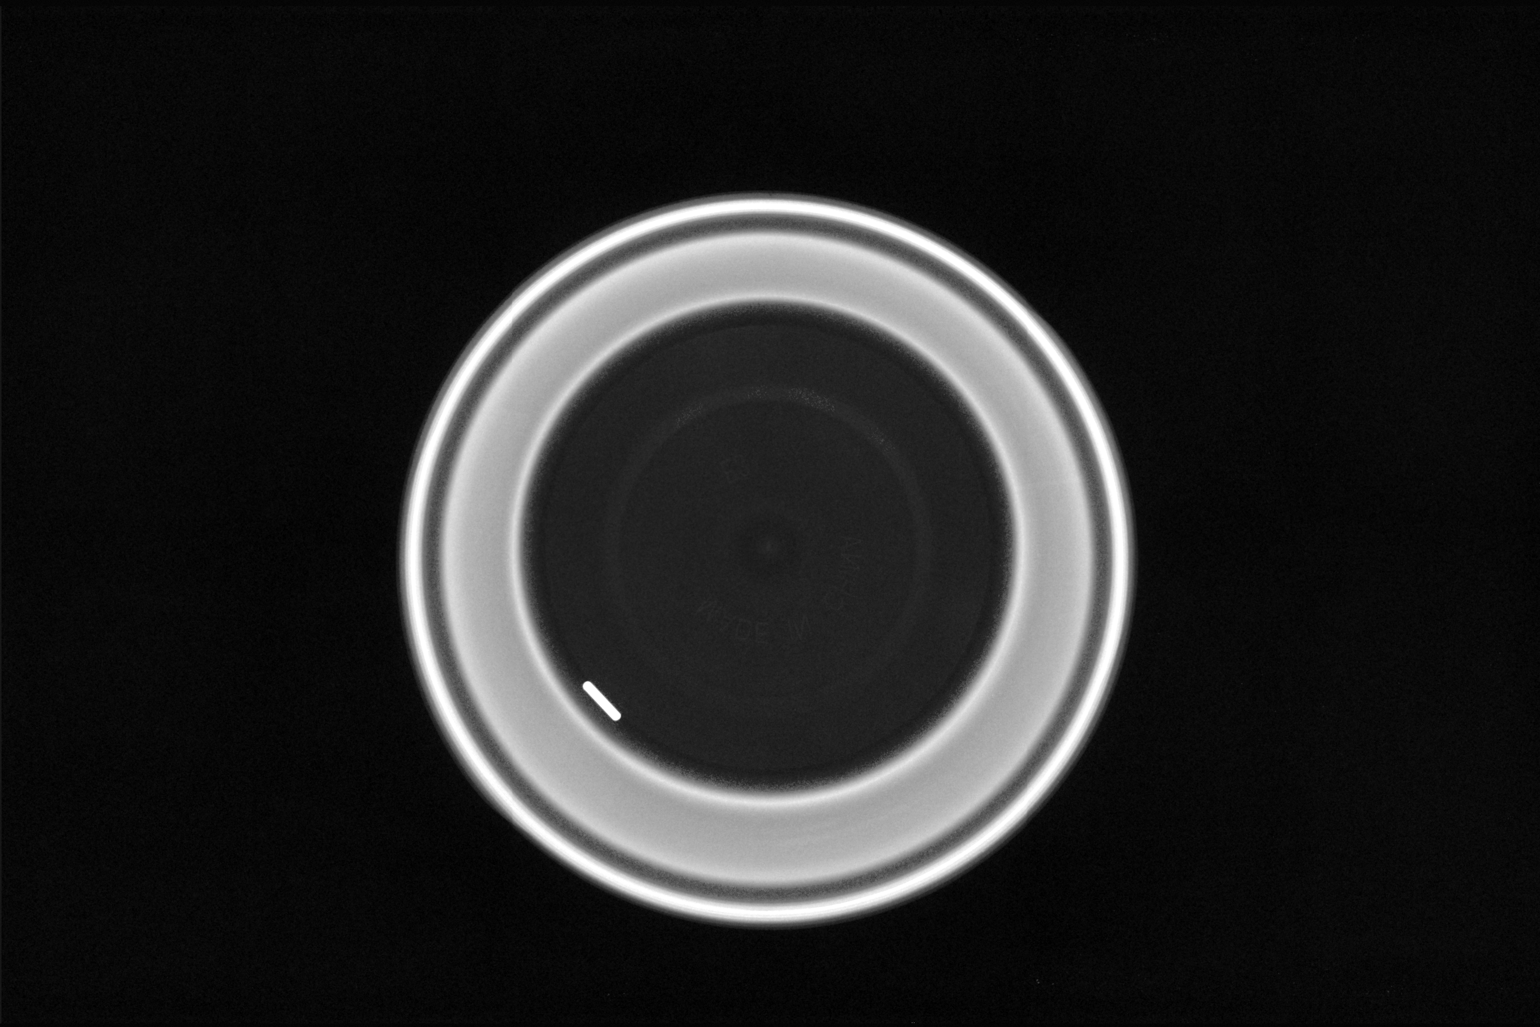

[1 of 1 positions shown; findings below may reference images not displayed]

FINDINGS: Patient presents for radioactive seed localization prior to
excisional biopsies of 2 biopsy-proven high risk papillomas in the
LEFT breast. I met with the patient and we discussed the procedure
of seed localization including benefits and alternatives. We
discussed the high likelihood of a successful procedure. We
discussed the risks of the procedure including infection, bleeding,
tissue injury and further surgery. We discussed the low dose of
radioactivity involved in the procedure. Informed, written consent
was given.

The usual time-out protocol was performed immediately prior to the
procedure.

Because the biopsy tissue marker clips are approximately 2.5 cm
apart, each clip was localized separately.

Using mammographic guidance, sterile technique with chlorhexidine as
skin antisepsis, 1% lidocaine as local anesthesia, an 6-G3K
radioactive seed was used to initially localize the coil shaped
tissue marker clip in the UPPER INNER subareolar location using a
MEDIAL approach. A second 6-G3K radioactive seed was used to then
localize the heart shaped tissue marker clip in the LOWER INNER
subareolar location using a MEDIAL approach.

The follow-up mammogram images confirm that both seeds are in the
expected location. The initial seed is approximately 6 mm INFERIOR
to the coil clip and the second seed is approximately mm MEDIAL to
the heart clip. Images are marked for Dr. Hyogo.

Follow-up survey of the patient confirms presence of both
radioactive seeds.

COIL CLIP, UPPER INNER SUBAREOLAR LOCATION:

Order number of [id number] seed: [id number]

Total activity: 0.248 mCi

Reference Date: 04/16/2018

HEART CLIP, LOWER INNER SUBAREOLAR LOCATION:

Order number of 6-[id number] seed: [id number]

Total activity: 0.251 mCi

Reference Date: 03/22/2018

The patient tolerated the procedure well and was released from the
[REDACTED]. She was given instructions regarding seed removal.
IMPRESSION: Radioactive seed localization of 2 separate high risk papillomas
involving the UPPER INNER subareolar location and LOWER INNER
subareolar location of the LEFT breast. No apparent complications.

## 2019-11-27 IMAGING — MG DIGITAL DIAGNOSTIC BILATERAL MAMMOGRAM WITH TOMO AND CAD
8 series · 9 of 24 positions shown · non-contrast
Comparison: Previous exam(s).

CLINICAL DATA: Previous surgical excision for left breast
papillomas. Annual mammography.

EXAM:
DIGITAL DIAGNOSTIC BILATERAL MAMMOGRAM WITH CAD AND TOMO

[R CC synth-2D]
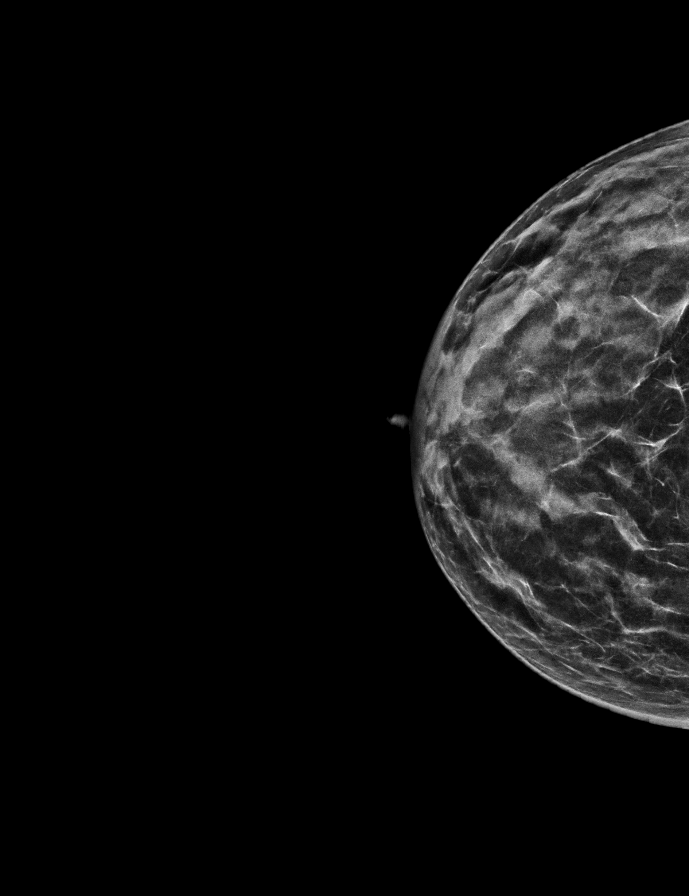

[L MLO synth-2D]
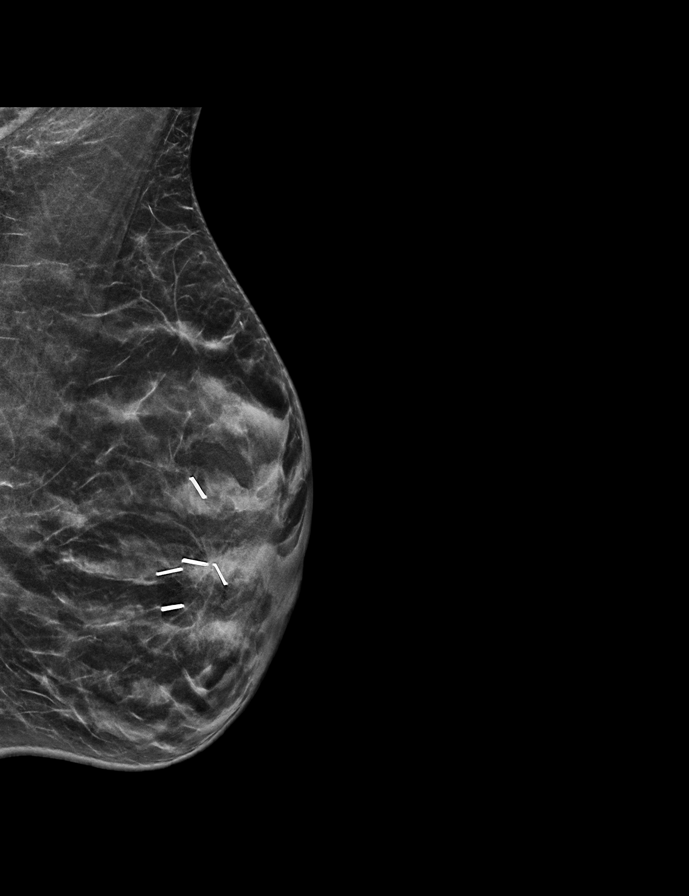

[R MLO synth-2D]
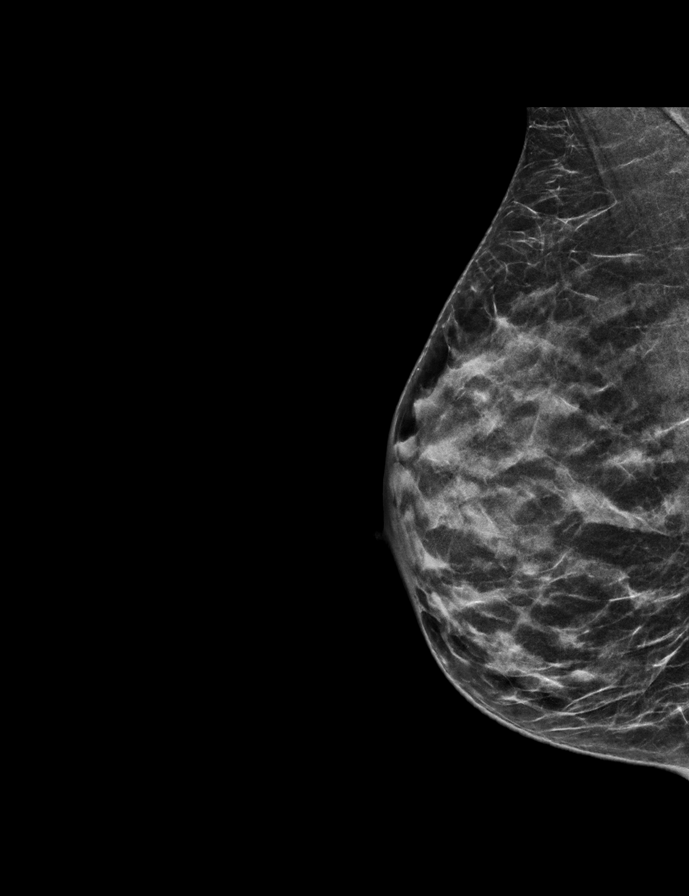

[L CC synth-2D]
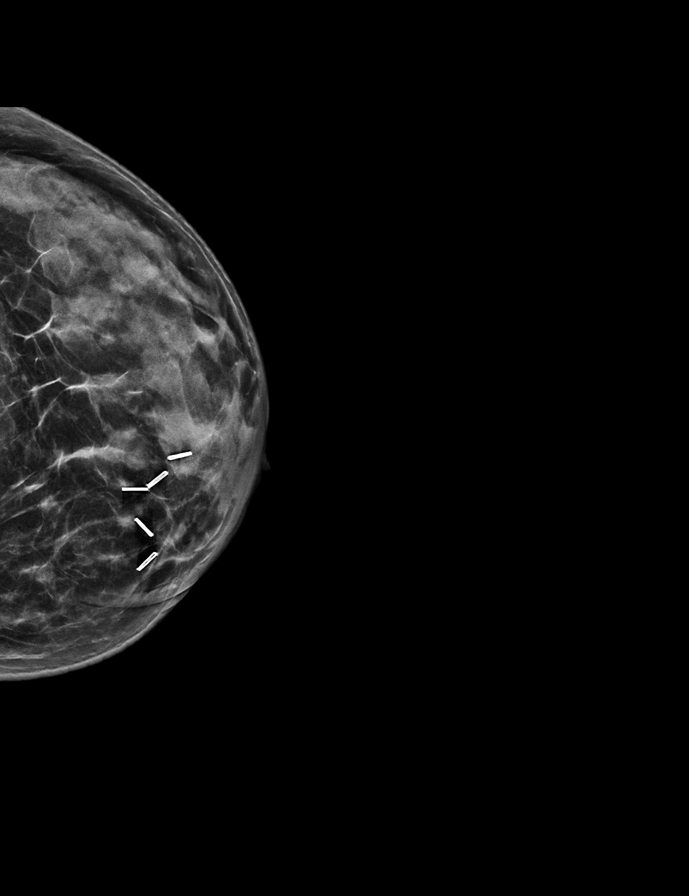

[R CC tomo · 2 of 46 frames shown]
[frame 15/46]
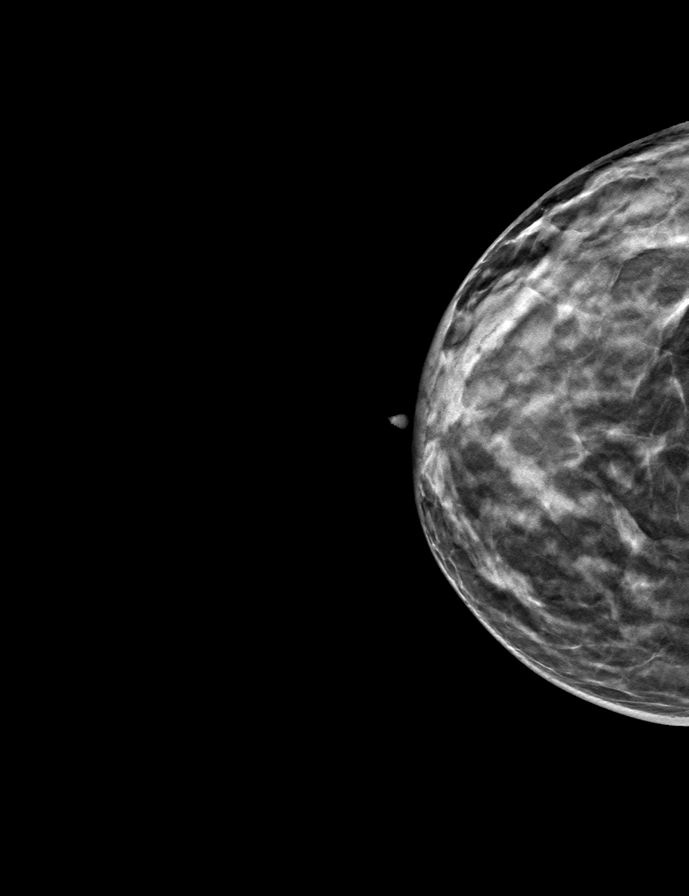
[frame 23/46]
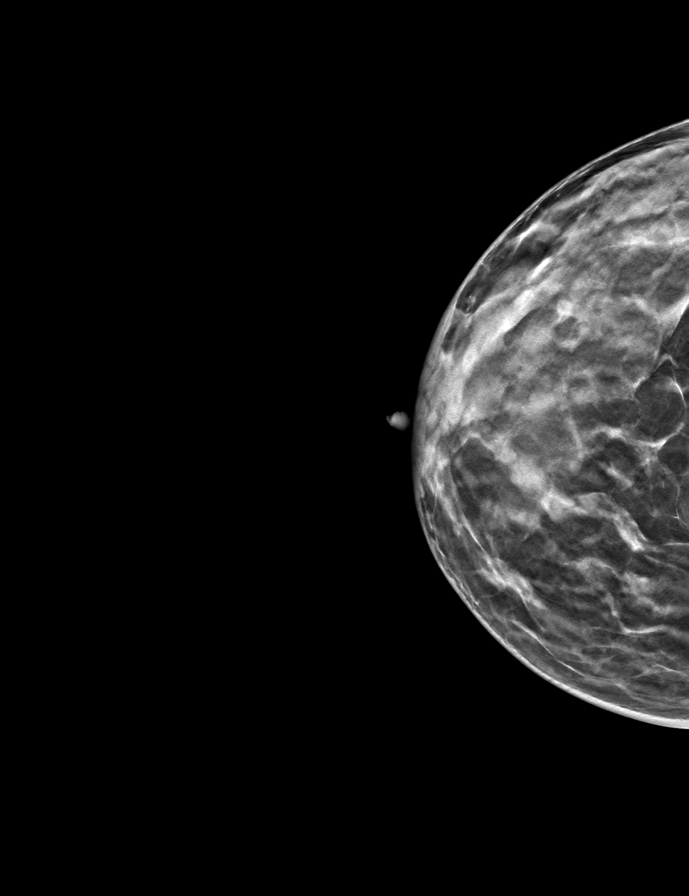

[L MLO tomo · tomo slice 23/45.0]
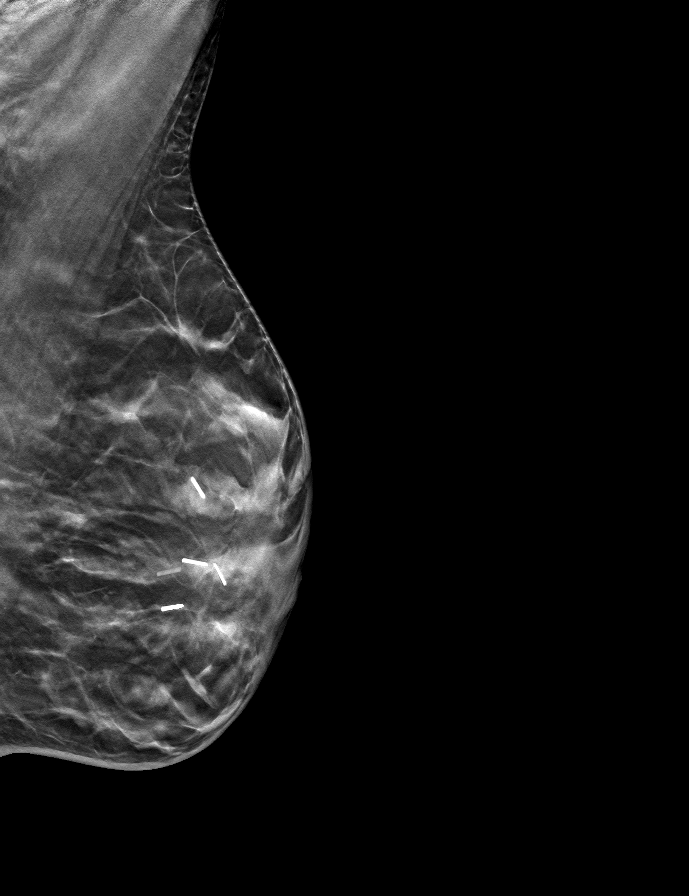

[L CC tomo · tomo slice 25/50.0]
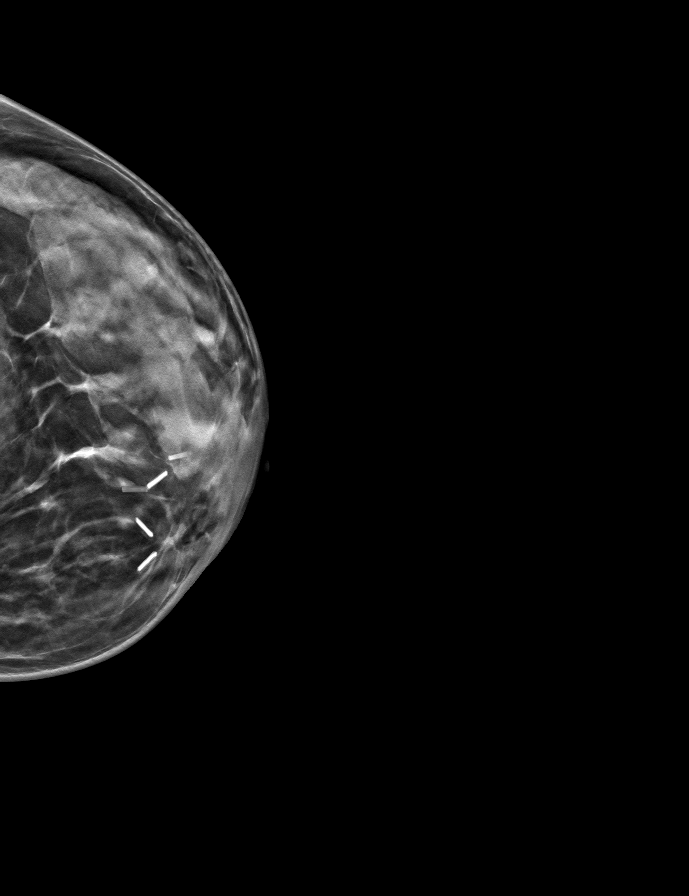

[R MLO tomo · tomo slice 23/44.0]
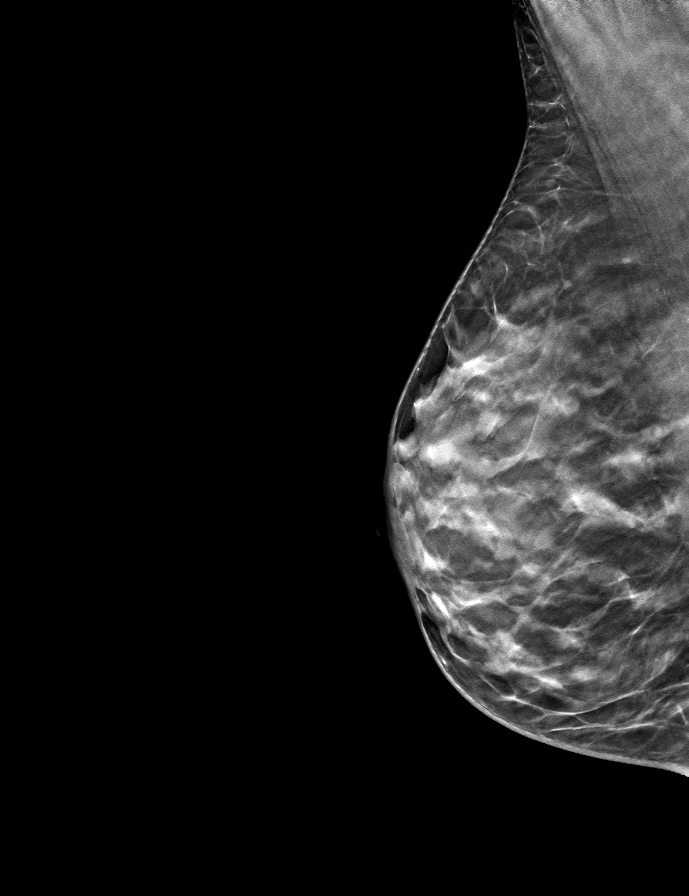

[9 of 24 positions shown; findings below may reference images not displayed]

ACR Breast Density Category c: The breast tissue is heterogeneously
dense, which may obscure small masses.
FINDINGS: No suspicious masses, calcifications, or distortion in either
breast. Postsurgical changes in the left breast at the site of
papilloma removal.

Mammographic images were processed with CAD.
IMPRESSION: No mammographic evidence of malignancy.

RECOMMENDATION:
Annual screening mammography.

I have discussed the findings and recommendations with the patient.
Results were also provided in writing at the conclusion of the
visit. If applicable, a reminder letter will be sent to the patient
regarding the next appointment.

BI-RADS CATEGORY  2: Benign.

## 2022-03-16 ENCOUNTER — Other Ambulatory Visit: Payer: Self-pay | Admitting: Registered Nurse

## 2022-03-16 DIAGNOSIS — Z8249 Family history of ischemic heart disease and other diseases of the circulatory system: Secondary | ICD-10-CM

## 2022-05-16 ENCOUNTER — Other Ambulatory Visit: Payer: 59

## 2022-05-23 ENCOUNTER — Ambulatory Visit
Admission: RE | Admit: 2022-05-23 | Discharge: 2022-05-23 | Disposition: A | Discharge status: Home / Self Care | Payer: 59 | Source: Ambulatory Visit | Attending: Registered Nurse | Admitting: Registered Nurse

## 2022-05-23 DIAGNOSIS — Z8249 Family history of ischemic heart disease and other diseases of the circulatory system: Secondary | ICD-10-CM

## 2022-07-31 ENCOUNTER — Other Ambulatory Visit: Payer: Self-pay | Admitting: Gastroenterology

## 2022-07-31 DIAGNOSIS — R7989 Other specified abnormal findings of blood chemistry: Secondary | ICD-10-CM

## 2022-07-31 DIAGNOSIS — K824 Cholesterolosis of gallbladder: Secondary | ICD-10-CM

## 2022-10-04 ENCOUNTER — Other Ambulatory Visit: Payer: 59

## 2022-12-11 ENCOUNTER — Other Ambulatory Visit: Payer: Self-pay | Admitting: Gastroenterology

## 2022-12-11 ENCOUNTER — Ambulatory Visit
Admission: RE | Admit: 2022-12-11 | Discharge: 2022-12-11 | Disposition: A | Discharge status: Home / Self Care | Payer: 59 | Source: Ambulatory Visit | Attending: Gastroenterology | Admitting: Gastroenterology

## 2022-12-11 DIAGNOSIS — R7989 Other specified abnormal findings of blood chemistry: Secondary | ICD-10-CM

## 2022-12-11 DIAGNOSIS — K824 Cholesterolosis of gallbladder: Secondary | ICD-10-CM

## 2024-03-24 ENCOUNTER — Other Ambulatory Visit: Payer: Self-pay | Admitting: Gastroenterology

## 2024-03-24 DIAGNOSIS — R7989 Other specified abnormal findings of blood chemistry: Secondary | ICD-10-CM

## 2024-03-24 DIAGNOSIS — K824 Cholesterolosis of gallbladder: Secondary | ICD-10-CM

## 2024-04-02 ENCOUNTER — Other Ambulatory Visit: Payer: Self-pay | Admitting: Registered Nurse

## 2024-04-02 DIAGNOSIS — K824 Cholesterolosis of gallbladder: Secondary | ICD-10-CM

## 2024-04-02 DIAGNOSIS — R7989 Other specified abnormal findings of blood chemistry: Secondary | ICD-10-CM

## 2024-04-04 ENCOUNTER — Ambulatory Visit
Admission: RE | Admit: 2024-04-04 | Discharge: 2024-04-04 | Disposition: A | Discharge status: Home / Self Care | Source: Ambulatory Visit | Attending: Gastroenterology | Admitting: Gastroenterology

## 2024-04-04 ENCOUNTER — Other Ambulatory Visit: Payer: Self-pay | Admitting: Registered Nurse

## 2024-04-04 DIAGNOSIS — K824 Cholesterolosis of gallbladder: Secondary | ICD-10-CM

## 2024-04-04 DIAGNOSIS — R7989 Other specified abnormal findings of blood chemistry: Secondary | ICD-10-CM

## 2024-07-28 ENCOUNTER — Ambulatory Visit: Attending: Internal Medicine | Admitting: Internal Medicine

## 2024-07-28 ENCOUNTER — Encounter: Payer: Self-pay | Admitting: Internal Medicine

## 2024-07-28 VITALS — BP 170/90 | HR 84 | Ht 66.0 in | Wt 120.4 lb

## 2024-07-28 DIAGNOSIS — I10 Essential (primary) hypertension: Secondary | ICD-10-CM

## 2024-07-28 DIAGNOSIS — Z8774 Personal history of (corrected) congenital malformations of heart and circulatory system: Secondary | ICD-10-CM | POA: Diagnosis not present

## 2024-07-28 NOTE — Progress Notes (Signed)
 Cardiology Office Note   Date:  11/10/2   ID:  Gabrielle Haney, Gabrielle Haney 02-28-1966, MRN [id number]  PCP:  Clarice Nottingham, MD  Cardiologist:   Vina Gull, MD    Pt presents for continued cardiac care    History of Present Illness: Gabrielle Haney is a 58 y.o. female with a history of coarctation of the aorta.   She is s/p repair at age 26 yo [facility]).   She was followed as a child after then moved to a small town and did not follow up      The pt is very active   Denies CP  Breathing is good   No CP  No dizziness.  No palpitations. Walks about 3 mles per day    Exercises          Current Meds  Medication Sig   Calcium Carb-Cholecalciferol (CALCIUM-VITAMIN D PO) Take by mouth in the morning and at bedtime. (Patient taking differently: Take by mouth in the morning and at bedtime. Per patient taking 600 mg twice a day)   cetirizine (ZYRTEC) 5 MG tablet Take 5 mg by mouth daily.   Cholecalciferol (VITAMIN D3) 50 MCG (2000 UT) capsule Take 2,000 Units by mouth daily.   Grape Seed 30 MG CAPS Take by mouth daily. (Patient taking differently: Take by mouth daily. Per patient taking 3 tablets in the morning and 3 tablets at night)   levothyroxine (SYNTHROID) 75 MCG tablet Take 75 mcg by mouth daily.   [DISCONTINUED] Oyster Shell Calcium 500 MG TABS daily.     Allergies:   Patient has no known allergies.   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 She  has never used smokeless tobacco. She reports that she does not currently use alcohol. She reports that she does not use drugs.   Family History:  The patient's family history includes Breast cancer (age of onset: 69) in her mother.    ROS:  Please see the history of present illness. All other systems are reviewed and  Negative to the above problem except as noted.    PHYSICAL EXAM: VS:  BP (!) 170/90 (BP Location: Left Arm, Patient Position: Sitting, Cuff Size: Normal)   Pulse 84   Ht 5' 6 (1.676 m)   Wt 120 lb 6.4 oz (54.6 kg)   SpO2 99%   BMI 19.43 kg/m   BP on my check 170/82 (L arm)  GEN: Well nourished, well developed, in no acute distress  HEENT: normal  Neck: no JVD, carotid bruits  Cardiac: RRR; no murmurs, Respiratory:  clear to auscultation bilaterally,  Murmur heart L back GI: soft, nontender, no masses  No hepatomegaly  Ext  No LE edema   2+ PT pulses   Pulses with equal onset thorughout  EKG:  EKG is ordered today.  NSR  84   SL ST depression laterally    Lipid Panel No results found for:  CHOL, TRIG, HDL, CHOLHDL, VLDL, LDLCALC, LDLDIRECT    Wt Readings from Last 3 Encounters:  07/28/24 120 lb 6.4 oz (54.6 kg)  04/29/18 118 lb 6 oz (53.7 kg)      ASSESSMENT AND PLAN:  1  Hx of coarctation of aorta. She is s/p repair in at age 69   She has not had regular follow up    Doing well   Murmur heard L back  Pulses equal onset throughout  Will get cardiac MRI to assess LV, aorta  2  Blood pressure   PT pt is very hypertensive today   She says it is usually better    I have asked her to follow closely over the next few weeks  3  HCM   Hgb 85  HDL 91    Will check HgbA1C     Follow up will be based on results    Current medicines are reviewed at length with the patient today.  The patient does not have concerns regarding medicines.  Signed, Vina Gull, MD

## 2024-07-28 NOTE — Patient Instructions (Addendum)
 Medication Instructions:  Your physician recommends that you continue on your current medications as directed. Please refer to the Current Medication list given to you today.  *If you need a refill on your cardiac medications before your next appointment, please call your pharmacy*  Lab Work: 2 weeks prior to cardiac MRI: hematocrit and hemoglobin You may have this drawn at any [facility], no appointment needed. If you have labs (blood work) drawn today and your tests are completely normal, you will receive your results only by: MyChart Message (if you have MyChart) OR A paper copy in the mail If you have any lab test that is abnormal or we need to change your treatment, we will call you to review the results.  Testing/Procedures: CARDIAC MRI Your physician has requested that you have a cardiac MRI. Cardiac MRI uses a computer to create images of your heart as its beating, producing both still and moving pictures of your heart and major blood vessels. For further information please visit [email or web address]. Please follow the instruction sheet given to you today for more information.   Follow-Up: At [facility], you and your health needs are our priority.  As part of our continuing mission to provide you with exceptional heart care, our providers are all part of one team.  This team includes your primary Cardiologist (physician) and Advanced Practice Providers or APPs (Physician Assistants and Nurse Practitioners) who all work together to provide you with the care you need, when you need it.  Your next appointment:   To be determined after results are reviewed  Provider:   Vina Gull, MD   We recommend signing up for the patient portal called MyChart.  Sign up information is provided on this After Visit Summary.  MyChart is used to connect with patients for Virtual Visits (Telemedicine).  Patients are able to view lab/test results, encounter notes, upcoming appointments, etc.   Non-urgent messages can be sent to your provider as well.    To learn more about what you can do with MyChart, go to [email or web address].   Other Instructions Please check your blood pressure at home and send a list of your readings to Dr. Gull via MyChart message in the next 2-3 weeks.  HOW TO TAKE YOUR BLOOD PRESSURE Rest 5 minutes before taking your blood pressure. Don't  smoke or drink caffeinated beverages for at least 30 minutes before. Take your blood pressure before (not after) you eat. Sit comfortably with your back supported and both feet on the floor ( don't cross your legs). Elevate your arm to heart level on a table or a desk. Use the proper sized cuff.  It should fit smoothly and snugly around your bare upper arm. There should be enough room to slip a fingertip under the cuff.  The bottom edge of the cuff should be 1 inch above the crease of the elbow.  Please monitor your blood pressure once daily 2 hours after your am medication. If you blood pressure consistently remains above 140 (systolic) top number or over 90 ( diastolic) bottom number X 3 days consecutively.  Please call our office at [phone] or send Mychart message.         Please arrive for your appointment 30-45 minutes prior to test start time.   [facility] [address] [place], [place] [address] [phone] Please take advantage of the free valet parking available at the [facility] and [employer] (Entrance C).  Proceed to the [facility] (First  Floor) for check-in.    Magnetic resonance imaging (MRI) is a painless test that produces images of the inside of the body without using Xrays.   During an MRI, strong magnets and radio waves work together in a data processing manager to form detailed images.    MRI images may provide more details about a medical condition than X-rays, CT scans, and ultrasounds can provide.   You may be given earphones to listen for  instructions.   You may eat a light breakfast and take medications as ordered with the exception of furosemide, hydrochlorothiazide, or spironolactone(fluid pill, other). Please avoid stimulants for 12 hr prior to test. (Ie. Caffeine, nicotine, chocolate, or antihistamine medications)   If a contrast material will be used, an IV will be inserted into one of your veins. Contrast material will be injected into your IV. It will leave your body through your urine within a day. You may be told to drink plenty of fluids to help flush the contrast material out of your system.   You will be asked to remove all metal, including: Watch, jewelry, and other metal objects including hearing aids, hair pieces and dentures. Also wearable glucose monitoring systems (ie. Freestyle Libre and Omnipods) (Braces and fillings normally are not a problem.)   TEST WILL TAKE APPROXIMATELY 1 HOUR   PLEASE NOTIFY SCHEDULING AT LEAST 24 HOURS IN ADVANCE IF YOU ARE UNABLE TO KEEP YOUR APPOINTMENT. [phone]   For more information and frequently asked questions, please visit our website : [email or web address]   Please call Camie Shutter, cardiac imaging nurse navigator with any questions/concerns. Camie Shutter RN Navigator Cardiac Imaging Chantal Requena RN Navigator Cardiac Imaging [facility] [phone] Office

## 2024-08-01 LAB — HEMOGLOBIN AND HEMATOCRIT, BLOOD
Hematocrit: 36.4 % (ref 34.0–46.6)
Hemoglobin: 12.6 g/dL (ref 11.1–15.9)

## 2024-08-03 ENCOUNTER — Ambulatory Visit: Payer: Self-pay | Admitting: Internal Medicine

## 2024-08-03 ENCOUNTER — Encounter: Payer: Self-pay | Admitting: Internal Medicine

## 2024-08-03 DIAGNOSIS — Z131 Encounter for screening for diabetes mellitus: Secondary | ICD-10-CM

## 2024-08-05 ENCOUNTER — Other Ambulatory Visit (HOSPITAL_COMMUNITY): Payer: Self-pay | Admitting: *Deleted

## 2024-08-05 ENCOUNTER — Encounter (HOSPITAL_COMMUNITY): Payer: Self-pay

## 2024-08-05 DIAGNOSIS — Z8774 Personal history of (corrected) congenital malformations of heart and circulatory system: Secondary | ICD-10-CM | POA: Insufficient documentation

## 2024-08-06 NOTE — Telephone Encounter (Signed)
 I think she should be on a BP medicine Would recomm amlodipine  2.5 mg    Follow BP at home   Will take a couple weeks to have a full effect Goal is SBP less than 130

## 2024-08-07 ENCOUNTER — Other Ambulatory Visit: Payer: Self-pay

## 2024-08-07 DIAGNOSIS — Z131 Encounter for screening for diabetes mellitus: Secondary | ICD-10-CM

## 2024-08-07 MED ORDER — AMLODIPINE BESYLATE 2.5 MG PO TABS: 2.5000 mg | ORAL_TABLET | Freq: Every day | ORAL | 3 refills | Status: DC

## 2024-08-07 NOTE — Addendum Note (Signed)
 Addended by: VICCI ROXIE CROME on: 08/07/2024 08:05 AM   Modules accepted: Orders

## 2024-08-08 ENCOUNTER — Other Ambulatory Visit: Payer: Self-pay | Admitting: Internal Medicine

## 2024-08-08 ENCOUNTER — Ambulatory Visit (HOSPITAL_COMMUNITY)
Admission: RE | Admit: 2024-08-08 | Discharge: 2024-08-08 | Disposition: A | Discharge status: Home / Self Care | Source: Ambulatory Visit | Attending: Internal Medicine | Admitting: Internal Medicine

## 2024-08-08 DIAGNOSIS — Z8774 Personal history of (corrected) congenital malformations of heart and circulatory system: Secondary | ICD-10-CM | POA: Insufficient documentation

## 2024-08-08 MED ORDER — GADOBUTROL 1 MMOL/ML IV SOLN
8.0000 mL | Freq: Once | INTRAVENOUS | Status: AC | PRN
  Administered 2024-08-08: 8 mL via INTRAVENOUS

## 2024-08-12 ENCOUNTER — Other Ambulatory Visit: Payer: Self-pay | Admitting: Internal Medicine

## 2024-08-12 ENCOUNTER — Encounter: Payer: Self-pay | Admitting: Internal Medicine

## 2024-08-12 ENCOUNTER — Ambulatory Visit: Payer: Self-pay | Admitting: Internal Medicine

## 2024-08-12 DIAGNOSIS — Z8774 Personal history of (corrected) congenital malformations of heart and circulatory system: Secondary | ICD-10-CM

## 2024-08-21 LAB — HEMOGLOBIN A1C
Est. average glucose Bld gHb Est-mCnc: 108 mg/dL
Hgb A1c MFr Bld: 5.4 % (ref 4.8–5.6)

## 2024-08-24 ENCOUNTER — Ambulatory Visit: Payer: Self-pay | Admitting: Internal Medicine

## 2024-08-29 ENCOUNTER — Ambulatory Visit (HOSPITAL_COMMUNITY)
Admission: RE | Admit: 2024-08-29 | Discharge: 2024-08-29 | Disposition: A | Source: Ambulatory Visit | Attending: Cardiology | Admitting: Cardiology

## 2024-08-29 DIAGNOSIS — R011 Cardiac murmur, unspecified: Secondary | ICD-10-CM | POA: Diagnosis not present

## 2024-08-29 DIAGNOSIS — Z8774 Personal history of (corrected) congenital malformations of heart and circulatory system: Secondary | ICD-10-CM

## 2024-08-30 LAB — ECHOCARDIOGRAM COMPLETE
Area-P 1/2: 2.87 cm2
Est EF: >75
S' Lateral: 1.8 cm

## 2024-08-31 ENCOUNTER — Ambulatory Visit: Payer: Self-pay | Admitting: Internal Medicine

## 2024-09-30 ENCOUNTER — Telehealth: Payer: Self-pay | Admitting: Internal Medicine

## 2024-09-30 ENCOUNTER — Other Ambulatory Visit: Payer: Self-pay | Admitting: Internal Medicine

## 2024-09-30 ENCOUNTER — Encounter: Payer: Self-pay | Admitting: Internal Medicine

## 2024-09-30 NOTE — Telephone Encounter (Signed)
" °*  STAT* If patient is at the pharmacy, call can be transferred to refill team.   1. Which medications need to be refilled? (please list name of each medication and dose if known)   amLODipine  (NORVASC ) 2.5 MG tablet   2. Would you like to learn more about the convenience, safety, & potential cost savings by using the [facility]?   3. Are you open to using the [facility] (Type [facility]. ).  4. Which pharmacy/location (including street and city if local pharmacy) is medication to be sent to?  [id number] - [place], [place] - [address] AT [address]   5. Do they need a 30 day or 90 day supply?   90 day  Caller Leobardo) stated patient will be completely out of this medication today.  Caller stated they will need a new prescription with new dosage instructions for 1 tablet 2x daily.  "

## 2024-10-01 MED ORDER — AMLODIPINE BESYLATE 2.5 MG PO TABS: 2.5000 mg | ORAL_TABLET | Freq: Every day | ORAL | 3 refills | Status: DC

## 2024-10-01 MED ORDER — AMLODIPINE BESYLATE 5 MG PO TABS: 5.0000 mg | ORAL_TABLET | Freq: Two times a day (BID) | ORAL | 3 refills | Status: AC

## 2024-10-01 NOTE — Telephone Encounter (Signed)
 Patient needs refills for amlodipine   SHe is now taking it  5 mg bid since her BP was high PLease call in Rx for this   Also--  Pt will be seen at [facility] in March. Please fax copy of  1.  Clinic note  2. Echo report 3. MRI report to   Charlie Latina, MD  Their fax number is :[phone]

## 2024-10-01 NOTE — Addendum Note (Signed)
 Addended by: RANDY HAMP SAILOR on: 10/01/2024 02:56 PM   Modules accepted: Orders

## 2024-10-01 NOTE — Telephone Encounter (Signed)
 Pt's husband stated this refill request was denied by insurance due to the instructions not being updated on the new prescription when he called and requested it yesterday. He stated insurance said it was too soon for a refill but that's due to instructions not being updated to 1 tablet twice daily instead of once. Pt is now out of medication. Please advise.

## 2024-10-01 NOTE — Telephone Encounter (Signed)
 Yes, sorry OK to fill this Could make 5 bid and she can cut in 1/2 ahd save or take 5 daily  Called pt reports is taking amlodipine  5 mg PO BID BP ranges 130/75.  Advised pt will send in prescription to pharmacy the way she is taking medication.  If has further concerns to contact our office. Pt expresses understanding.

## 2024-10-01 NOTE — Telephone Encounter (Signed)
"  °  Signed     ----- Message from Vina Gull, MD sent at 10/01/2024  1:18 PM EST ----- Regarding: fax number I just sent a message about refills and what to fax   I gave the wrong Fax number   The fax number for Dr Dianah is:   [phone]           "

## 2024-10-01 NOTE — Telephone Encounter (Signed)
 Pt's medication was sent to pt's pharmacy as requested. Confirmation received.

## 2024-10-01 NOTE — Telephone Encounter (Deleted)
-----   Message from Vina Gull, MD sent at 10/01/2024  1:18 PM EST ----- Regarding: fax number I just sent a message about refills and what to fax  I gave the wrong Fax number   The fax number for Dr Dianah is:  [phone]

## 2024-10-01 NOTE — Telephone Encounter (Signed)
 This encounter was created in error - please disregard.

## 2024-10-08 ENCOUNTER — Encounter: Payer: Self-pay | Admitting: Internal Medicine

## 2024-10-13 NOTE — Telephone Encounter (Signed)
 Spoke to patient  Called in Rx for Toprol XL 50 mg  Take 1/2 tab daily for 2 day then 50 mg daily Called in 1 month supply with 6 refills to [facility] in [place] ( [address])
